# Patient Record
Sex: Female | Born: 2000 | Race: White | Hispanic: No | Marital: Single | Smoking: Never smoker
Health system: Southern US, Community
[De-identification: ages and names within clinical notes are randomized; demographics above are authoritative.]

## PROBLEM LIST (undated history)

## (undated) DIAGNOSIS — D580 Hereditary spherocytosis: Secondary | ICD-10-CM

## (undated) DIAGNOSIS — M928 Other specified juvenile osteochondrosis: Secondary | ICD-10-CM

## (undated) HISTORY — DX: Other specified juvenile osteochondrosis: M92.8

## (undated) HISTORY — DX: Hereditary spherocytosis: D58.0

---
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
## 2018-01-09 ENCOUNTER — Telehealth: Payer: Self-pay | Admitting: Pediatrics

## 2018-01-09 NOTE — Telephone Encounter (Signed)
Mother Claire Kaiser [phone] came by to make appt for pt to be seen and re start birth control for PCOS. Pt cell [phone]. Pt uses [facility] on [address] [place]. Out since Sunday 01/06/18. Gave mother nurse line ph# [phone]. Advised pt to call to schedule appt. Per mother pt will be going to college in the fall.

## 2018-01-11 ENCOUNTER — Other Ambulatory Visit: Payer: Self-pay | Admitting: Pediatrics

## 2018-01-11 MED ORDER — NORETHIN ACE-ETH ESTRAD-FE 1.5-30 MG-MCG PO TABS: 1.0000 | ORAL_TABLET | Freq: Every day | ORAL | 11 refills | Status: DC

## 2018-01-11 NOTE — Telephone Encounter (Signed)
Called mother and stated patient will need to be seen before we can refill. She states she will relay information to patient and she will call and schedule at her convenience.

## 2018-01-11 NOTE — Telephone Encounter (Signed)
Done

## 2018-01-17 ENCOUNTER — Ambulatory Visit (INDEPENDENT_AMBULATORY_CARE_PROVIDER_SITE_OTHER): Payer: BC Managed Care – PPO | Admitting: Pediatrics

## 2018-01-17 ENCOUNTER — Encounter: Payer: Self-pay | Admitting: Pediatrics

## 2018-01-17 VITALS — BP 174/93 | HR 100 | Ht 67.32 in | Wt 267.2 lb

## 2018-01-17 DIAGNOSIS — Z23 Encounter for immunization: Secondary | ICD-10-CM | POA: Diagnosis not present

## 2018-01-17 DIAGNOSIS — E282 Polycystic ovarian syndrome: Secondary | ICD-10-CM | POA: Diagnosis not present

## 2018-01-17 DIAGNOSIS — Z9081 Acquired absence of spleen: Secondary | ICD-10-CM | POA: Diagnosis not present

## 2018-01-17 DIAGNOSIS — I10 Essential (primary) hypertension: Secondary | ICD-10-CM

## 2018-01-17 DIAGNOSIS — Z113 Encounter for screening for infections with a predominantly sexual mode of transmission: Secondary | ICD-10-CM | POA: Diagnosis not present

## 2018-01-17 DIAGNOSIS — D580 Hereditary spherocytosis: Secondary | ICD-10-CM | POA: Diagnosis not present

## 2018-01-17 DIAGNOSIS — L700 Acne vulgaris: Secondary | ICD-10-CM

## 2018-01-17 MED ORDER — LISINOPRIL 5 MG PO TABS: 5.0000 mg | ORAL_TABLET | Freq: Every day | ORAL | 3 refills | Status: DC

## 2018-01-17 MED ORDER — NORETHIN ACE-ETH ESTRAD-FE 1.5-30 MG-MCG PO TABS: 1.0000 | ORAL_TABLET | Freq: Every day | ORAL | 3 refills | Status: DC

## 2018-01-17 NOTE — Patient Instructions (Addendum)
You need one more dose of the meningiococcal B vaccine. Get this at your pediatrician.  Take your blood pressures for a few days when you get home. Send them to me on mychart.  Labs today. We will send the results on mychart.

## 2018-01-17 NOTE — Progress Notes (Signed)
THIS RECORD MAY CONTAIN CONFIDENTIAL INFORMATION THAT SHOULD NOT BE RELEASED WITHOUT REVIEW OF THE SERVICE PROVIDER.  Adolescent Medicine Consultation Follow-Up Visit Claire Kaiser  is a 17 y.o. female referred by Ermalinda BarriosBrassfield, Mark, MD here today for follow-up regarding PCOS, OCP.    Last seen in [facility] on 06/2016 for the above.  Plan at last visit included continuing OCP.  Pertinent Labs? No Growth Chart Viewed? yes   History was provided by the patient and mother.  Interpreter? no  PCP Confirmed?  Needs to establish with adult provider- going to college in the fall    My Chart Activated?   no   Chief Complaint  Patient presents with  . Follow-up    HPI:    Has not been in clinic in two years. Mom has a history of hypertension.  Mom and dad's family with cancer. Dad has Kallman syndrome.    LMP last week. Continues on OCP which is going well. She occasionally does continuous cycling to skip a period if she has something important going on.   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 Has questions about vaccines that she needs and would like the flu shot today.   Doing well in school and enjoying activities. Not doing much physical activity. Had a recent scar revision from a dog bite on her face. Feels that her blood pressure probably has been high- has had some headaches and some pounding that she can feel.   [place]ing to [place] tomorrow with school for 5 days.   Review of Systems  Constitutional: Negative for malaise/fatigue.  Eyes: Negative for double vision.  Respiratory: Negative for shortness of breath.   Cardiovascular: Negative for chest pain and palpitations.  Gastrointestinal: Negative for abdominal pain, constipation, diarrhea, nausea and vomiting.  Genitourinary: Negative for dysuria.  Musculoskeletal: Negative for joint pain and myalgias.  Skin: Negative for rash.  Neurological: Positive for headaches. Negative for dizziness.   Endo/Heme/Allergies: Does not bruise/bleed easily.  Psychiatric/Behavioral: Negative for depression. The patient is not nervous/anxious and does not have insomnia.      Patient's last menstrual period was 01/10/2018 (approximate). No Known Allergies Outpatient Medications Prior to Visit  Medication Sig Dispense Refill  . albuterol (PROAIR HFA) 108 (90 Base) MCG/ACT inhaler Inhale into the lungs. Reported on 06/14/2016    . Melatonin 5 MG TABS Take by mouth daily as needed.  0  . norethindrone-ethinyl estradiol-iron (MICROGESTIN FE,GILDESS FE,LOESTRIN FE) 1.5-30 MG-MCG tablet Take 1 tablet by mouth daily. 1 Package 11   No facility-administered medications prior to visit.      Patient Active Problem List   Diagnosis Date Noted  . Acquired asplenia 01/17/2018  . PCOS (polycystic ovarian syndrome) 03/02/2016  . Oligomenorrhea 01/16/2016  . Acne 12/22/2015  . Keratosis pilaris 12/22/2015  . Hereditary spherocytosis (HCC) 12/22/2015    Social History: Changes with school since last visit?  yes, going to [employer]  Activities:  Special interests/hobbies/sports: theater   Lifestyle habits that can impact QOL: Sleep:sleeping well  Eating habits/patterns: varied  Water intake: fair    Confidentiality was discussed with the patient and if applicable, with caregiver as well.  Changes at home or school since last visit:  no  Gender identity: female Sex assigned at birth: female Pronouns: she Tobacco?  no Drugs/ETOH?  yes, ETOH socially, MJ occasionally  Partner preference?  female  Sexually Active?  no  Pregnancy Prevention:  birth control pills Reviewed condoms:  yes Reviewed EC:  yes   Suicidal  or homicidal thoughts?   no Self injurious behaviors?  no Guns in the home?  no    The following portions of the patient's history were reviewed and updated as appropriate: allergies, current medications, past family history, past medical history, past social history,  past surgical history and problem list.  Physical Exam:  Vitals:   01/17/18 1503  BP: (!) 174/93  Pulse: 100  Weight: 267 lb 3.2 oz (121.2 kg)  Height: 5' 7.32" (1.71 m)   BP (!) 174/93   Pulse 100   Ht 5' 7.32" (1.71 m)   Wt 267 lb 3.2 oz (121.2 kg)   LMP 01/10/2018 (Approximate)   BMI 41.45 kg/m  Body mass index: body mass index is 41.45 kg/m. Blood pressure percentiles are >99 % systolic and >99 % diastolic based on the August 2017 AAP Clinical Practice Guideline. Blood pressure percentile targets: 90: 126/78, 95: 129/82, 95 + 12 mmHg: 141/94. This reading is in the Stage 2 hypertension range (BP >= 140/90).   Physical Exam  Constitutional: She appears well-developed. No distress.  HENT:  Mouth/Throat: Oropharynx is clear and moist.  Neck: No thyromegaly present.  Cardiovascular: Normal rate and regular rhythm.  No murmur heard. Pulmonary/Chest: Breath sounds normal.  Abdominal: Soft. She exhibits no mass. There is no tenderness. There is no guarding.  Musculoskeletal: She exhibits no edema.  Lymphadenopathy:    She has no cervical adenopathy.  Neurological: She is alert.  Skin: Skin is warm. No rash noted.  Psychiatric: She has a normal mood and affect.  Nursing note and vitals reviewed.   Assessment/Plan: 1. PCOS (polycystic ovarian syndrome) Continue junel 1.5/30. We discussed metformin and will consider it depending on labs. She has continued to have some weight gain but does not have major signs of insulin resistance on exam. Has had a previous fasting insulin that was elevated.  - norethindrone-ethinyl estradiol-iron (MICROGESTIN FE,GILDESS FE,LOESTRIN FE) 1.5-30 MG-MCG tablet; Take 1 tablet by mouth daily.  Dispense: 4 Package; Refill: 3  2. Acne vulgaris Well controlled.   3. Essential hypertension Labs today to eval for end organ function. Will likely start lisinopril after she returns from her trip to [facility]. Mom has a BP cuff at home so she will check it  some when she gets home and then start medication. Mom takes only amlodipine.  - Comprehensive metabolic panel - Lipid panel - Vitamin D (25 hydroxy) - Hemoglobin A1c - TSH - Urinalysis, Routine w reflex microscopic - Protein / creatinine ratio, urine - lisinopril (PRINIVIL,ZESTRIL) 5 MG tablet; Take 1 tablet (5 mg total) by mouth daily.  Dispense: 30 tablet; Refill: 3  4. 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 Has otherwise been fine.   5. Acquired asplenia Needs second meningio B vaccine. Discussed this today. She will go get from PCP. We vaccinated for flu today. Discussed high flu incidence right now and taking precautions while traveling.   6. Need for vaccination Per patient request.  - Flu Vaccine QUAD 36+ mos IM  7. Routine screening for STI (sexually transmitted infection) Per clinic protocol.  - C. trachomatis/N. gonorrhoeae RNA   Follow-up:  3 weeks for nurse visit for BP, provider visit before college.   Medical decision-making:  >25 minutes spent face to face with patient with more than 50% of appointment spent discussing diagnosis, management, follow-up, and reviewing of PCOS, hypertension, aslpenia, vaccines.

## 2018-01-18 ENCOUNTER — Encounter: Payer: Self-pay | Admitting: Pediatrics

## 2018-01-18 DIAGNOSIS — I10 Essential (primary) hypertension: Secondary | ICD-10-CM | POA: Insufficient documentation

## 2018-01-18 LAB — COMPREHENSIVE METABOLIC PANEL
AG Ratio: 1.3 (calc) (ref 1.0–2.5)
ALT: 17 U/L (ref 5–32)
AST: 19 U/L (ref 12–32)
Albumin: 4.4 g/dL (ref 3.6–5.1)
Alkaline phosphatase (APISO): 55 U/L (ref 47–176)
BUN: 11 mg/dL (ref 7–20)
CHLORIDE: 104 mmol/L (ref 98–110)
CO2: 25 mmol/L (ref 20–32)
CREATININE: 0.71 mg/dL (ref 0.50–1.00)
Calcium: 9.7 mg/dL (ref 8.9–10.4)
GLOBULIN: 3.5 g/dL (ref 2.0–3.8)
GLUCOSE: 102 mg/dL — AB (ref 65–99)
Potassium: 4.9 mmol/L (ref 3.8–5.1)
SODIUM: 138 mmol/L (ref 135–146)
TOTAL PROTEIN: 7.9 g/dL (ref 6.3–8.2)
Total Bilirubin: 0.4 mg/dL (ref 0.2–1.1)

## 2018-01-18 LAB — HEMOGLOBIN A1C
EAG (MMOL/L): 5.5 (calc)
Hgb A1c MFr Bld: 5.1 % of total Hgb (ref <5.7)
MEAN PLASMA GLUCOSE: 100 (calc)

## 2018-01-18 LAB — LIPID PANEL
CHOL/HDL RATIO: 3 (calc) (ref <5.0)
Cholesterol: 158 mg/dL (ref <170)
HDL: 52 mg/dL (ref >45)
LDL Cholesterol (Calc): 87 mg/dL (calc) (ref <110)
NON-HDL CHOLESTEROL (CALC): 106 mg/dL (ref <120)
TRIGLYCERIDES: 95 mg/dL — AB (ref <90)

## 2018-01-18 LAB — PROTEIN / CREATININE RATIO, URINE

## 2018-01-18 LAB — VITAMIN D 25 HYDROXY (VIT D DEFICIENCY, FRACTURES): Vit D, 25-Hydroxy: 20 ng/mL — ABNORMAL LOW (ref 30–100)

## 2018-01-18 LAB — TSH: TSH: 0.65 m[IU]/L

## 2018-01-18 LAB — URINALYSIS, ROUTINE W REFLEX MICROSCOPIC

## 2018-01-24 ENCOUNTER — Encounter: Payer: Self-pay | Admitting: Pediatrics

## 2018-01-25 ENCOUNTER — Encounter: Payer: Self-pay | Admitting: Pediatrics

## 2018-01-26 ENCOUNTER — Encounter: Payer: Self-pay | Admitting: Pediatrics

## 2018-01-27 ENCOUNTER — Encounter: Payer: Self-pay | Admitting: Pediatrics

## 2018-02-11 ENCOUNTER — Other Ambulatory Visit: Payer: Self-pay | Admitting: Pediatrics

## 2018-02-11 ENCOUNTER — Ambulatory Visit (INDEPENDENT_AMBULATORY_CARE_PROVIDER_SITE_OTHER): Payer: BC Managed Care – PPO

## 2018-02-11 VITALS — BP 144/85 | HR 92

## 2018-02-11 DIAGNOSIS — I10 Essential (primary) hypertension: Secondary | ICD-10-CM | POA: Diagnosis not present

## 2018-02-11 MED ORDER — LISINOPRIL 10 MG PO TABS: 10.0000 mg | ORAL_TABLET | Freq: Every day | ORAL | 3 refills | Status: DC

## 2018-02-11 NOTE — Progress Notes (Signed)
Pt here today for blood pressure recheck. BP improved from last visit but will increase to 10 mg of Lisinopril qdaily due to elevated pressure reading per Daiva Nakayamaaroline Hacker,NP. NP would like to have follow up appointment set for 3 months from last visit. Appointment made. Pt to report adverse effects to [facility].

## 2018-04-08 ENCOUNTER — Encounter: Payer: Self-pay | Admitting: Pediatrics

## 2018-04-08 ENCOUNTER — Ambulatory Visit (INDEPENDENT_AMBULATORY_CARE_PROVIDER_SITE_OTHER): Payer: BC Managed Care – PPO | Admitting: Pediatrics

## 2018-04-08 VITALS — BP 138/92 | HR 100 | Ht 67.56 in | Wt 269.2 lb

## 2018-04-08 DIAGNOSIS — N914 Secondary oligomenorrhea: Secondary | ICD-10-CM | POA: Diagnosis not present

## 2018-04-08 DIAGNOSIS — E282 Polycystic ovarian syndrome: Secondary | ICD-10-CM

## 2018-04-08 DIAGNOSIS — I10 Essential (primary) hypertension: Secondary | ICD-10-CM

## 2018-04-08 NOTE — Patient Instructions (Signed)
Continue 10 mg lisinopril  Take at least 2000 IU of vitamin D daily  Send me some blood pressures.

## 2018-04-08 NOTE — Progress Notes (Signed)
THIS RECORD MAY CONTAIN CONFIDENTIAL INFORMATION THAT SHOULD NOT BE RELEASED WITHOUT REVIEW OF THE SERVICE PROVIDER.  Adolescent Medicine Consultation Follow-Up Visit [name]  is a 17 y.o. female referred by Ermalinda Barrios, MD here today for follow-up regarding hypertension and PCOS.    Last seen in [facility] on 01/17/18 for hypertension.  Plan at last visit included increasing Lisinopril from 5 mg to 10 mg.  Pertinent Labs? No Growth Chart Viewed? yes   History was provided by the patient and mother.  Interpreter? no  PCP Confirmed?  yes  HPI:  Claire Kaiser is an 17 year old female presenting for follow up of hypertension and PCOS. She is accompanied by her mother but she provides the history:  - Claire Kaiser reports at her last visit her blood pressure was elevated and her Lisinopril was increased from 5 mg to 10 mg - She took 1 month of 10 mg dosing, however, her pharmacy re-fill was for 5 mg and she did not notice until ~ 2 days ago - She denies any HA, vision changes, chest pain, numbness/tingling of extremities, urinary symptoms, fatigue or lethargy - In regards to her OCP, she has been taking it with excellent compliance - She always takes the placebo dose of her pack - Reports her menses have been regular lasting for ~ 5 days with associated mild cramping at onset - She has had no breakthrough bleeding  No LMP recorded. No Known Allergies Outpatient Medications Prior to Visit  Medication Sig Dispense Refill  . albuterol (PROAIR HFA) 108 (90 Base) MCG/ACT inhaler Inhale into the lungs. Reported on 06/14/2016    . lisinopril (PRINIVIL,ZESTRIL) 10 MG tablet Take 1 tablet (10 mg total) by mouth daily. 30 tablet 3  . Melatonin 5 MG TABS Take by mouth daily as needed.  0  . norethindrone-ethinyl estradiol-iron (MICROGESTIN FE,GILDESS FE,LOESTRIN FE) 1.5-30 MG-MCG tablet Take 1 tablet by mouth daily. 4 Package 3   No facility-administered medications prior to  visit.      Patient Active Problem List   Diagnosis Date Noted  . Essential hypertension 01/18/2018  . Acquired asplenia 01/17/2018  . PCOS (polycystic ovarian syndrome) 03/02/2016  . Oligomenorrhea 01/16/2016  . Acne 12/22/2015  . Keratosis pilaris 12/22/2015  . Hereditary spherocytosis (HCC) 12/22/2015    Social History: Changes with school since last visit?  Yes Claire Kaiser will soon be graduating high school She plans to do a summer internship at [employer] by [employer] and will then attend college in [place]   Physical Exam:  Vitals:   04/08/18 0829 04/08/18 0906  BP: (!) 150/89 (!) 138/92  Pulse: (!) 113 100  Weight: 269 lb 3.2 oz (122.1 kg)   Height: 5' 7.56" (1.716 m)    BP (!) 138/92   Pulse 100   Ht 5' 7.56" (1.716 m)   Wt 269 lb 3.2 oz (122.1 kg)   BMI 41.47 kg/m  Body mass index: body mass index is 41.47 kg/m. Blood pressure percentiles are not available for patients who are 18 years or older.  Physical Exam  Constitutional: She is oriented to person, place, and time. She appears well-developed and well-nourished.  HENT:  Head: Normocephalic.  Mouth/Throat: Oropharynx is clear and moist.  Eyes: Pupils are equal, round, and reactive to light. Conjunctivae and EOM are normal.  Neck: Normal range of motion. No thyromegaly present.  Cardiovascular: Normal rate, regular rhythm and normal heart sounds.  Pulmonary/Chest: Effort normal and breath sounds normal.  Abdominal: Soft. Bowel sounds  are normal.  Neurological: She is alert and oriented to person, place, and time.  Skin: Skin is warm. Capillary refill takes 2 to 3 seconds.  Psychiatric: She has a normal mood and affect.    Assessment/Plan: Claire Kaiser is an 17 year old female with essential hypertension and PCOS presenting for follow-up of her chronic conditions. At her last visit on 01/17/18 her blood pressure was elevated to 174/93 and she was subsequently increased from 5 mg to 10 mg of Lisinopril. Unfortunately, due  to re-fill error, she has still been taking 5 mg. Review of history and physical exam is negative for red flag symptoms of hypertension and blood pressure is 138/92. Plan to start 10 mg dosing of Lisinopril and follow up in 3 months for any necessary titrations.   Essential Hypertension - Lisinopril 10 mg daily - Keep track of blood pressure daily - Follow up in 3 months  PCOS - Continue Norethindrone-ethinyl estradiol   Follow-up:  No follow-ups on file.   Medical decision-making:  > 30 minutes spent face to face with patient with more than 50% of appointment spent discussing diagnosis, management, follow-up, and reviewing of blood pressure.   Melida Quitter MD PGY-2 Pediatrics

## 2018-07-08 ENCOUNTER — Ambulatory Visit (INDEPENDENT_AMBULATORY_CARE_PROVIDER_SITE_OTHER): Payer: BC Managed Care – PPO | Admitting: Pediatrics

## 2018-07-08 ENCOUNTER — Encounter: Payer: Self-pay | Admitting: Pediatrics

## 2018-07-08 VITALS — BP 128/85 | HR 85 | Ht 67.52 in | Wt 271.0 lb

## 2018-07-08 DIAGNOSIS — E559 Vitamin D deficiency, unspecified: Secondary | ICD-10-CM

## 2018-07-08 DIAGNOSIS — I10 Essential (primary) hypertension: Secondary | ICD-10-CM | POA: Diagnosis not present

## 2018-07-08 DIAGNOSIS — N914 Secondary oligomenorrhea: Secondary | ICD-10-CM | POA: Diagnosis not present

## 2018-07-08 DIAGNOSIS — E282 Polycystic ovarian syndrome: Secondary | ICD-10-CM

## 2018-07-08 MED ORDER — LISINOPRIL 10 MG PO TABS: 10.0000 mg | ORAL_TABLET | Freq: Every day | ORAL | 3 refills | Status: DC

## 2018-07-08 MED ORDER — NORETHIN ACE-ETH ESTRAD-FE 1.5-30 MG-MCG PO TABS: 1.0000 | ORAL_TABLET | Freq: Every day | ORAL | 3 refills | Status: DC

## 2018-07-08 NOTE — Progress Notes (Signed)
History was provided by the patient and mother.  Claire SosKatherine E Kaiser is a 17 y.o. female who is here for essential hypertension, PCOS, secondary oligomenorrhea.  Ermalinda BarriosBrassfield, Mark, MD   HPI:  Pt reports she recently had an ear infection post flying and having a cold. Took amox and it is better.  [place].   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 This has been a good experience.   She is happy that her blood pressure has improved today.   She is having regular periods and no concerns with her birth control.   She continues to remove the hair under her chin, however, it is not bothersome to her and she does not feel like we need to add spironolactone at this point.   She is excited about leaving for [employer] in [place] soon. She can return to clinic here around Christmas break.    Patient's last menstrual period was 06/17/2018 (approximate).  Review of Systems  Constitutional: Negative for malaise/fatigue.  Eyes: Negative for double vision.  Respiratory: Negative for shortness of breath.   Cardiovascular: Negative for chest pain and palpitations.  Gastrointestinal: Negative for abdominal pain, constipation, diarrhea, nausea and vomiting.  Genitourinary: Negative for dysuria.  Musculoskeletal: Negative for joint pain and myalgias.  Skin: Negative for rash.  Neurological: Positive for headaches. Negative for dizziness.  Endo/Heme/Allergies: Does not bruise/bleed easily.    Patient Active Problem List   Diagnosis Date Noted  . Essential hypertension 01/18/2018  . Acquired asplenia 01/17/2018  . PCOS (polycystic ovarian syndrome) 03/02/2016  . Oligomenorrhea 01/16/2016  . Acne 12/22/2015  . Keratosis pilaris 12/22/2015  . Hereditary spherocytosis (HCC) 12/22/2015    Current Outpatient Medications on File Prior to Visit  Medication Sig Dispense Refill  . albuterol (PROAIR HFA) 108 (90 Base) MCG/ACT inhaler Inhale into  the lungs. Reported on 06/14/2016    . Melatonin 5 MG TABS Take by mouth daily as needed.  0  . norethindrone-ethinyl estradiol-iron (MICROGESTIN FE,GILDESS FE,LOESTRIN FE) 1.5-30 MG-MCG tablet Take 1 tablet by mouth daily. 4 Package 3  . lisinopril (PRINIVIL,ZESTRIL) 10 MG tablet Take 1 tablet (10 mg total) by mouth daily. 30 tablet 3   No current facility-administered medications on file prior to visit.     No Known Allergies  Social History: Confidentiality was discussed with the patient and if applicable, with caregiver as well. Tobacco: none Secondhand smoke exposure? no Drugs/EtOH: none Sexually active? no  Safety: safe at home Last STI Screening: last visit Pregnancy Prevention: OCP  Physical Exam:    Vitals:   07/08/18 0842  BP: 128/85  Pulse: 85  Weight: 271 lb (122.9 kg)  Height: 5' 7.52" (1.715 m)    Blood pressure percentiles are not available for patients who are 18 years or older.  Physical Exam  Constitutional: She is oriented to person, place, and time. She appears well-developed and well-nourished.  HENT:  Head: Normocephalic.  Neck: No thyromegaly present.  Cardiovascular: Normal rate, regular rhythm, normal heart sounds and intact distal pulses.  Pulmonary/Chest: Effort normal and breath sounds normal.  Abdominal: Soft. Bowel sounds are normal. There is no tenderness.  Musculoskeletal: Normal range of motion.  Neurological: She is alert and oriented to person, place, and time.  Skin: Skin is warm and dry.  Psychiatric: She has a normal mood and affect.    Assessment/Plan: 1. PCOS (polycystic ovarian syndrome) Will continue OCP. She does not feel that she needs spironolcatone  for now. Will repeat labs when she comes around christmas. Continues on vitamin D 2000 IU daily.  - norethindrone-ethinyl estradiol-iron (MICROGESTIN FE,GILDESS FE,LOESTRIN FE) 1.5-30 MG-MCG tablet; Take 1 tablet by mouth daily.  Dispense: 4 Package; Refill: 3  2. Secondary  oligomenorrhea OCP managing periods well.   3. Essential hypertension conitnue lisinopril 10 mg for now. Discussed that her blood pressure still isn't all the way at target, however, I anticipate her physical activity will likely increase at school so we will reassess when she comes at Christmas. If she has any visits to acute care providers in [place] and it is persistently elevated, we can increase lisinopril prior to next visit. She is in agreement.

## 2018-11-18 ENCOUNTER — Ambulatory Visit: Payer: BC Managed Care – PPO | Admitting: Pediatrics

## 2018-11-20 ENCOUNTER — Ambulatory Visit (INDEPENDENT_AMBULATORY_CARE_PROVIDER_SITE_OTHER): Payer: BC Managed Care – PPO | Admitting: Pediatrics

## 2018-11-20 ENCOUNTER — Encounter: Payer: Self-pay | Admitting: Pediatrics

## 2018-11-20 ENCOUNTER — Other Ambulatory Visit: Payer: Self-pay

## 2018-11-20 VITALS — BP 139/84 | HR 96 | Ht 67.32 in | Wt 268.4 lb

## 2018-11-20 DIAGNOSIS — E559 Vitamin D deficiency, unspecified: Secondary | ICD-10-CM | POA: Diagnosis not present

## 2018-11-20 DIAGNOSIS — I10 Essential (primary) hypertension: Secondary | ICD-10-CM | POA: Diagnosis not present

## 2018-11-20 DIAGNOSIS — E282 Polycystic ovarian syndrome: Secondary | ICD-10-CM | POA: Diagnosis not present

## 2018-11-20 DIAGNOSIS — Z113 Encounter for screening for infections with a predominantly sexual mode of transmission: Secondary | ICD-10-CM | POA: Diagnosis not present

## 2018-11-20 MED ORDER — LISINOPRIL 20 MG PO TABS: 20.0000 mg | ORAL_TABLET | Freq: Every day | ORAL | 2 refills | Status: DC

## 2018-11-20 NOTE — Progress Notes (Signed)
THIS RECORD MAY CONTAIN CONFIDENTIAL INFORMATION THAT SHOULD NOT BE RELEASED WITHOUT REVIEW OF THE SERVICE PROVIDER.  Adolescent Medicine Consultation Follow-Up Visit Claire Kaiser  is a 17 y.o. female referred by Ermalinda Barrios, MD here today for follow-up regarding HTN, PCOS, and secondary oligomenorrhea.    Last seen in [facility] Clinic on 07/08/18 for HTN, PCOS, secondary oligomenorrhea   Plan at last visit included  1. Continue OCP (microgestin FE), did not want spironolactone at that time  2. Repeat lab visit at next visit  3. Continue vitamin D 2000 IU daily  4. Continue lisinopril 10mg  and increased physical activity    History was provided by the patient.   Chief Complaint  Patient presents with  . Follow-up    HPI:   Claire Kaiser is a 17 y.o. female w PMHx of PCOS, HTN, and secondary oligomenorrhea presenting for routine follow up. 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 Patient reports enjoying college. States she is doing well, is busy but good. States she is making lots of friends.   Overall patient feels PCOS is well controlled. States she "feels great". Periods have been regular, occurring monthly. Periods last 4 days with medium flow. During periods her cramps "are not terrible", usually just the first day and are well controlled with ibuprofen and midol. Patient does notice more back pain during her periods. Pain is in her lower lumbar/sacral area. Usually back pain starts 1 day before onset of period and lasts the first 2 days of her period.   Reports no significant weight changes. Does state she lost some weight during first semester but gained some weight back due to stress eating during finals. No significant hair growth. Normal appetite (other than during finals). Patient not interested in spironolactone at this time.   Patient reports that although BP is elevated today, her BP is always lower at home. Reports  systolic pressure is usually 10 points lower at home. Has not checked BP since going away for college. Denies CP, palpitations, HA, vision changes, dizziness, or SOB.  No LMP recorded. No Known Allergies Outpatient Medications Prior to Visit  Medication Sig Dispense Refill  . albuterol (PROAIR HFA) 108 (90 Base) MCG/ACT inhaler Inhale into the lungs. Reported on 06/14/2016    . Melatonin 5 MG TABS Take by mouth daily as needed.  0  . norethindrone-ethinyl estradiol-iron (MICROGESTIN FE,GILDESS FE,LOESTRIN FE) 1.5-30 MG-MCG tablet Take 1 tablet by mouth daily. 4 Package 3  . lisinopril (PRINIVIL,ZESTRIL) 10 MG tablet Take 1 tablet (10 mg total) by mouth daily. 90 tablet 3   No facility-administered medications prior to visit.      Patient Active Problem List   Diagnosis Date Noted  . Vitamin D insufficiency 07/08/2018  . Essential hypertension 01/18/2018  . Acquired asplenia 01/17/2018  . PCOS (polycystic ovarian syndrome) 03/02/2016  . Oligomenorrhea 01/16/2016  . Acne 12/22/2015  . Keratosis pilaris 12/22/2015  . Hereditary spherocytosis (HCC) 12/22/2015    The following portions of the patient's history were reviewed and updated as appropriate: allergies, current medications, past family history, past medical history, past social history, past surgical history and problem list.  Physical Exam:  Vitals:   11/20/18 1407 11/20/18 1410  BP: (!) 142/89 139/84  Pulse: 96 96  Weight: 268 lb 6.4 oz (121.7 kg)   Height: 5' 7.32" (1.71 m)    BP 139/84   Pulse 96   Ht 5' 7.32" (1.71 m)   Wt 268  lb 6.4 oz (121.7 kg)   BMI 41.63 kg/m  Body mass index: body mass index is 41.63 kg/m. Blood pressure percentiles are not available for patients who are 18 years or older.  Physical Exam Vitals signs and nursing note reviewed.  Constitutional:      Appearance: Normal appearance.  HENT:     Head: Normocephalic.     Nose: Nose normal.     Mouth/Throat:     Mouth: Mucous membranes are  moist.  Eyes:     Conjunctiva/sclera: Conjunctivae normal.     Pupils: Pupils are equal, round, and reactive to light.  Neck:     Musculoskeletal: Normal range of motion.  Cardiovascular:     Rate and Rhythm: Normal rate and regular rhythm.     Pulses: Normal pulses.     Heart sounds: No murmur. No friction rub. No gallop.   Pulmonary:     Effort: Pulmonary effort is normal.     Breath sounds: Normal breath sounds. No wheezing, rhonchi or rales.  Abdominal:     General: Abdomen is flat. Bowel sounds are normal.     Palpations: There is no mass.     Tenderness: There is no abdominal tenderness.  Musculoskeletal: Normal range of motion.        General: No tenderness.  Skin:    General: Skin is warm.  Neurological:     General: No focal deficit present.     Mental Status: She is alert.  Psychiatric:        Mood and Affect: Mood normal.        Behavior: Behavior normal.     Assessment/Plan: 1. PCOS (polycystic ovarian syndrome)  Will continue OCP. Does not want spironolactone now but states she may decide she wants it in the future. Will repeat baseline labs today. Continue on vitamin D 2000 IU daily. Back pain likely 2/2 new mattress at school. Advised using ibuprofen as needed.  - continue norethindrone-ethinyl estradiol-iron (MICROGESTIN FE,GILDESS FE,LOESTRIN FE) 1.5-30 MG-MCG tablet; Take 1 tablet by mouth daily.  Dispense: 4 Package; Refill: 3 -continue Vit D -PCOS Labs  Last A1C 5.1 on 01/17/18 Due 01/17/2019  Last CMP 01/17/18 (wnl) Due 01/17/19  Last lipid panel 01/17/18 (wnl, slightly elevated triglyceride level of 95)  Due 01/18/2020  Last Vit D 01/07/18, low Due today  2. Secondary oligomenorrhea  OCP managing periods well. -continue OCP    3. Essential hypertension Elevated today. Will plan to increase lisinopril to 20 mg. Advised to check BP at home to ensure BP is well controlled on new regimen. Advised RN BP check in 1 week to ensure adequate BP control.     Follow-up:  Return in about 6 months (around 05/22/2019).   Orpah ClintonSherin Zakhai Meisinger, DO, PGY-2 [facility] 11/20/2018 3:00 PM

## 2018-11-20 NOTE — Patient Instructions (Addendum)
Come back for blood pressure check on 12/30.  Come back during spring break or around Easter.  We will get labs next time you come in  Let us know if you need anything!

## 2018-11-22 LAB — C. TRACHOMATIS/N. GONORRHOEAE RNA
C. trachomatis RNA, TMA: NOT DETECTED
N. gonorrhoeae RNA, TMA: NOT DETECTED

## 2018-12-02 ENCOUNTER — Ambulatory Visit: Payer: BC Managed Care – PPO

## 2018-12-02 VITALS — BP 139/90 | HR 104

## 2018-12-02 DIAGNOSIS — I10 Essential (primary) hypertension: Secondary | ICD-10-CM

## 2018-12-02 NOTE — Progress Notes (Signed)
Pt here today for BP recheck since increase in lisinopril. BP elevated today. Reading may be altered by increase in stress due [place] and recent travel. Plan to f/u in March. Pt to contact [facility] if she has questions or concerns.

## 2019-02-26 ENCOUNTER — Ambulatory Visit: Payer: BC Managed Care – PPO | Admitting: Pediatrics

## 2019-02-26 ENCOUNTER — Telehealth (INDEPENDENT_AMBULATORY_CARE_PROVIDER_SITE_OTHER): Payer: BC Managed Care – PPO | Admitting: Pediatrics

## 2019-02-26 DIAGNOSIS — I10 Essential (primary) hypertension: Secondary | ICD-10-CM | POA: Diagnosis not present

## 2019-02-26 DIAGNOSIS — E282 Polycystic ovarian syndrome: Secondary | ICD-10-CM | POA: Diagnosis not present

## 2019-02-26 MED ORDER — LISINOPRIL 20 MG PO TABS: 20.0000 mg | ORAL_TABLET | Freq: Every day | ORAL | 2 refills | Status: DC

## 2019-02-26 MED ORDER — METFORMIN HCL ER 500 MG PO TB24: 1500.0000 mg | ORAL_TABLET | Freq: Every day | ORAL | 2 refills | Status: DC

## 2019-02-26 NOTE — Telephone Encounter (Signed)
Tried TC to patient at 1:33 pm. No answer and unable to LVM.   Virtual Visit via Telephone Note  I connected with patient  on 02/26/19 at 1:45 pm by telephone and verified that I am speaking with the correct person using two identifiers.   I discussed the limitations, risks, security and privacy concerns of performing an evaluation and management service by telephone and the availability of in person appointments. I discussed that the purpose of this phone visit is to provide medical care while limiting exposure to the novel coronavirus.  I also discussed with the patient that there may be a patient responsible charge related to this service. The patient expressed understanding and agreed to proceed.  Reason for visit: PCOS, hypertension   History of Present Illness:  Talked about adding metformin at last visit for things like weight fluctuation. She has had more of this during this semester. She is not noticing changes in number necessarily, but she would notice clothing feeling tighter or looser week to week. She can't say exactly why. No changes to diet and exercise on these weeks. She is interested in starting metformin at this point.   She is starting to walk the dog and do some yoga from home. She is asplenic so is trying to stay safe and healthy.   She hasn't been checking her blood pressure at all. She hasn't noticed any heart racing, or other symptoms that may indicate it is high. She has started meditation at school for stress management which has helped her a lot.   She has some hair that grows on her chin and neck area. Hasn't noticed any major increase in this. She manages it with shaving. No acne issues that she has had. Her skin has been good despite stress.   School hasn't called off end of the year, will re-eval in 2 weeks. She has not started distance learning yet- this is their scheduled spring break. She will start on Monday. Waiting on communication from professors.     Assessment and Plan:  1. PCOS - continue OCP. Start metformin XR 500 mg. Take a multivitamin every day when you are on Metformin. Take Metformin XR 500 mg 1 pill at dinner once daily for 2 weeks Then, take Metformin XR 500 mg 2 pills at dinner once daily for 2 weeks Then, take Metformin XR 500 mg 3 pills at dinner once daily until you see the doctor for your next visit. If you have too much nausea or diarrhea, decrease your dose for 2 weeks and then try to go back up again.  We will get labs prior to return to school in August   2. Hypertension Continue lisinopril 20 mg. She will take her blood pressure at home over the coming week and send via [employer]. Continue meditation and increasing physical activity   3. Vit d deficiency  Continue daily vit D supplement.    Follow Up Instructions: Late august for labs.    I discussed the assessment and treatment plan with the patient and/or parent/guardian. They were provided an opportunity to ask questions and all were answered. They agreed with the plan and demonstrated an understanding of the instructions.   They were advised to call back or seek an in-person evaluation if the symptoms worsen or if the condition fails to improve as anticipated.  I provided 15 minutes of non-face-to-face time during this encounter. I was located off site during this encounter.  Alfonso Ramus, FNP

## 2019-05-26 ENCOUNTER — Other Ambulatory Visit: Payer: Self-pay | Admitting: Pediatrics

## 2019-05-26 DIAGNOSIS — E282 Polycystic ovarian syndrome: Secondary | ICD-10-CM

## 2019-05-31 ENCOUNTER — Encounter: Payer: Self-pay | Admitting: Pediatrics

## 2019-07-28 ENCOUNTER — Other Ambulatory Visit: Payer: Self-pay | Admitting: Family

## 2019-07-28 DIAGNOSIS — E282 Polycystic ovarian syndrome: Secondary | ICD-10-CM

## 2019-07-28 MED ORDER — NORETHIN ACE-ETH ESTRAD-FE 1.5-30 MG-MCG PO TABS: 1.0000 | ORAL_TABLET | Freq: Every day | ORAL | 3 refills | Status: DC

## 2019-08-18 ENCOUNTER — Other Ambulatory Visit: Payer: Self-pay | Admitting: Pediatrics

## 2019-08-18 DIAGNOSIS — E282 Polycystic ovarian syndrome: Secondary | ICD-10-CM

## 2019-08-25 ENCOUNTER — Other Ambulatory Visit: Payer: Self-pay | Admitting: Pediatrics

## 2019-08-25 DIAGNOSIS — E282 Polycystic ovarian syndrome: Secondary | ICD-10-CM

## 2019-08-25 MED ORDER — METFORMIN HCL ER 500 MG PO TB24: ORAL_TABLET | ORAL | 6 refills | Status: DC

## 2020-03-15 ENCOUNTER — Other Ambulatory Visit: Payer: Self-pay | Admitting: Pediatrics

## 2020-03-15 DIAGNOSIS — E282 Polycystic ovarian syndrome: Secondary | ICD-10-CM

## 2020-03-31 ENCOUNTER — Telehealth (INDEPENDENT_AMBULATORY_CARE_PROVIDER_SITE_OTHER): Payer: BC Managed Care – PPO | Admitting: Pediatrics

## 2020-03-31 DIAGNOSIS — I10 Essential (primary) hypertension: Secondary | ICD-10-CM

## 2020-03-31 MED ORDER — LISINOPRIL 20 MG PO TABS: 20.0000 mg | ORAL_TABLET | Freq: Every day | ORAL | 2 refills | Status: DC

## 2020-03-31 NOTE — Patient Instructions (Signed)
Your Lisinopril 20mg  daily was refilled We will try to get your visit rescheduled for when you are back in town and then [facility] get your labs drawn too.

## 2020-03-31 NOTE — Progress Notes (Signed)
This note is not being shared with the patient for the following reason: To prevent harm (release of this note would result in harm to the life or physical safety of the patient or another).  THIS RECORD MAY CONTAIN CONFIDENTIAL INFORMATION THAT SHOULD NOT BE RELEASED WITHOUT REVIEW OF THE SERVICE PROVIDER.  Virtual Follow-Up Visit via Video Note  I connected with Claire Kaiser, the patient  on 03/31/20 at 11:30 AM EDT by a video enabled telemedicine application and verified that I am speaking with the correct person using two identifiers.   Patient/parent location: [place] at [employer]  - Unable to complete full visit today because patient currently out of state.  - Discussed visit for when patient is back in [place] - Refilled Lisinopril 20mg  qD as patient states she is running out of this med  Follow-up: At a later date when patient returns to [employer] [facility]. Plan to do labs (HgbA1C, CMP, Lipid Panel, Vit D level) and BP check around that time also   I spent 10 minutes on this telehealth visit inclusive of face-to-face video and care coordination time I was located remote office during this encounter.   [facility], MD    CC: Teodoro Kil, MD, No ref. provider found

## 2020-08-02 ENCOUNTER — Other Ambulatory Visit: Payer: Self-pay | Admitting: Pediatrics

## 2020-08-02 DIAGNOSIS — E282 Polycystic ovarian syndrome: Secondary | ICD-10-CM

## 2020-08-02 MED ORDER — NORETHIN ACE-ETH ESTRAD-FE 1.5-30 MG-MCG PO TABS: 1.0000 | ORAL_TABLET | Freq: Every day | ORAL | 3 refills | Status: DC

## 2020-09-08 ENCOUNTER — Other Ambulatory Visit: Payer: Self-pay | Admitting: Family

## 2020-09-08 DIAGNOSIS — E282 Polycystic ovarian syndrome: Secondary | ICD-10-CM

## 2020-11-10 ENCOUNTER — Other Ambulatory Visit: Payer: Self-pay | Admitting: Pediatrics

## 2020-11-10 DIAGNOSIS — E282 Polycystic ovarian syndrome: Secondary | ICD-10-CM

## 2021-05-12 ENCOUNTER — Other Ambulatory Visit: Payer: Self-pay | Admitting: Pediatrics

## 2021-05-12 DIAGNOSIS — E282 Polycystic ovarian syndrome: Secondary | ICD-10-CM

## 2021-05-24 ENCOUNTER — Other Ambulatory Visit: Payer: Self-pay | Admitting: Pediatrics

## 2021-05-24 DIAGNOSIS — I10 Essential (primary) hypertension: Secondary | ICD-10-CM

## 2021-05-24 DIAGNOSIS — E282 Polycystic ovarian syndrome: Secondary | ICD-10-CM

## 2021-05-25 ENCOUNTER — Other Ambulatory Visit: Payer: Self-pay | Admitting: Pediatrics

## 2021-05-25 DIAGNOSIS — E282 Polycystic ovarian syndrome: Secondary | ICD-10-CM

## 2021-05-25 DIAGNOSIS — I10 Essential (primary) hypertension: Secondary | ICD-10-CM

## 2021-05-25 MED ORDER — METFORMIN HCL ER 500 MG PO TB24
ORAL_TABLET | ORAL | 0 refills | Status: DC
Start: 2021-05-25 — End: 2021-08-22

## 2021-05-25 MED ORDER — LISINOPRIL 20 MG PO TABS: 20.0000 mg | ORAL_TABLET | Freq: Every day | ORAL | 1 refills | Status: DC

## 2021-06-23 ENCOUNTER — Ambulatory Visit: Payer: BC Managed Care – PPO | Admitting: Pediatrics

## 2021-07-25 ENCOUNTER — Ambulatory Visit: Payer: BC Managed Care – PPO | Admitting: Pediatrics

## 2021-07-25 ENCOUNTER — Other Ambulatory Visit (HOSPITAL_COMMUNITY)
Admission: RE | Admit: 2021-07-25 | Discharge: 2021-07-25 | Disposition: A | Discharge status: Home / Self Care | Payer: BC Managed Care – PPO | Source: Ambulatory Visit | Attending: Pediatrics | Admitting: Pediatrics

## 2021-07-25 ENCOUNTER — Other Ambulatory Visit: Payer: Self-pay

## 2021-07-25 VITALS — BP 138/89 | HR 109 | Ht 67.25 in | Wt 262.4 lb

## 2021-07-25 DIAGNOSIS — Z23 Encounter for immunization: Secondary | ICD-10-CM | POA: Diagnosis not present

## 2021-07-25 DIAGNOSIS — Z124 Encounter for screening for malignant neoplasm of cervix: Secondary | ICD-10-CM

## 2021-07-25 DIAGNOSIS — E282 Polycystic ovarian syndrome: Secondary | ICD-10-CM

## 2021-07-25 DIAGNOSIS — I10 Essential (primary) hypertension: Secondary | ICD-10-CM | POA: Diagnosis not present

## 2021-07-25 DIAGNOSIS — Z113 Encounter for screening for infections with a predominantly sexual mode of transmission: Secondary | ICD-10-CM | POA: Insufficient documentation

## 2021-07-25 DIAGNOSIS — N898 Other specified noninflammatory disorders of vagina: Secondary | ICD-10-CM | POA: Diagnosis not present

## 2021-07-25 DIAGNOSIS — Z3202 Encounter for pregnancy test, result negative: Secondary | ICD-10-CM | POA: Diagnosis not present

## 2021-07-25 LAB — POCT URINE PREGNANCY: Preg Test, Ur: NEGATIVE

## 2021-07-25 MED ORDER — NORGESTIMATE-ETH ESTRADIOL 0.25-35 MG-MCG PO TABS: ORAL_TABLET | ORAL | 4 refills | Status: AC

## 2021-07-25 NOTE — Patient Instructions (Signed)
Take a series of blood pressures at home and send them to me  Labs today- will send results on mychart!

## 2021-07-25 NOTE — Progress Notes (Signed)
History was provided by the patient.  Claire Kaiser is a 20 y.o. female who is here for hypertension, pcos, needs pap.  Ermalinda Barrios, MD   HPI:  Pt reports she has one more year of college. Majoring in [place] and religion and wants to go in to law/[employer]/non[employer]- looking in the [place] area.   Anxious with blood pressures. Has not done any ambulatory monitoring.   Still taking metformin which is going well.   OCP is going well. In the past 6 months has noticed more spotting than she did in the past. In the week leaving up to cycle she can have some spotting. Open to making change in OCP today. Not sexually active and has never been.   No LMP recorded.   Patient Active Problem List   Diagnosis Date Noted   Vitamin D insufficiency 07/08/2018   Essential hypertension 01/18/2018   Acquired asplenia 01/17/2018   PCOS (polycystic ovarian syndrome) 03/02/2016   Oligomenorrhea 01/16/2016   Acne 12/22/2015   Keratosis pilaris 12/22/2015   Hereditary spherocytosis (HCC) 12/22/2015    Current Outpatient Medications on File Prior to Visit  Medication Sig Dispense Refill   Acetaminophen (TYLENOL PO) Take by mouth.     albuterol (VENTOLIN HFA) 108 (90 Base) MCG/ACT inhaler Inhale into the lungs. Reported on 06/14/2016     AUROVELA FE 1.5/30 1.5-30 MG-MCG tablet TAKE 1 TABLET BY MOUTH EVERY DAY 112 tablet 3   Cholecalciferol (VITAMIN D) 50 MCG (2000 UT) CAPS Take by mouth.     lisinopril (ZESTRIL) 20 MG tablet Take 1 tablet (20 mg total) by mouth daily. 90 tablet 1   Melatonin 5 MG TABS Take by mouth daily as needed.  0   metFORMIN (GLUCOPHAGE-XR) 500 MG 24 hr tablet TAKE 3 TABLETS(1500 MG) BY MOUTH DAILY WITH BREAKFAST 270 tablet 0   Multiple Vitamin (MULTIVITAMIN) tablet Take 1 tablet by mouth daily.     No current facility-administered medications on file prior to visit.    No Known Allergies  Social History: Confidentiality was discussed with the patient and if  applicable, with caregiver as well. Tobacco: no Secondhand smoke exposure? no Drugs/EtOH: no Sexually active? no  Safety: safe to self and at home Last STI Screening:today Pregnancy Prevention: ocp  Physical Exam:    Vitals:   07/25/21 1347 07/25/21 1350  BP: (!) 147/85 138/89  Pulse: (!) 102 (!) 109  Weight: 262 lb 6.4 oz (119 kg)   Height: 5' 7.25" (1.708 m)     Growth percentile SmartLinks can only be used for patients less than 62 years old.  Physical Exam Vitals and nursing note reviewed. Exam conducted with a chaperone present.  Constitutional:      General: She is not in acute distress.    Appearance: She is well-developed.  Neck:     Thyroid: No thyromegaly.  Cardiovascular:     Rate and Rhythm: Normal rate and regular rhythm.     Heart sounds: No murmur heard. Pulmonary:     Breath sounds: Normal breath sounds.  Abdominal:     Palpations: Abdomen is soft. There is no mass.     Tenderness: There is no abdominal tenderness. There is no guarding.  Genitourinary:    Labia:        Right: No rash.        Left: No rash.      Vagina: Normal.     Cervix: Normal.     Comments: Cervix difficult to visualize fully  due to discomfort during exam. Some discharge in vaginal vault Musculoskeletal:     Right lower leg: No edema.     Left lower leg: No edema.  Lymphadenopathy:     Cervical: No cervical adenopathy.  Skin:    General: Skin is warm.     Capillary Refill: Capillary refill takes less than 2 seconds.     Findings: No rash.  Neurological:     Mental Status: She is alert.     Comments: No tremor    Assessment/Plan: 1. PCOS (polycystic ovarian syndrome) Will change to sprintec to see if she has any benefit in controlling bleeding. Also may have less weight fluctuations with change in progestin. Yearly labs today.  - norgestimate-ethinyl estradiol (SPRINTEC 28) 0.25-35 MG-MCG tablet; Take 1 tablet daily. Discard placebos and take active pills for continuous  cycling  Dispense: 112 tablet; Refill: 4 - CBC with Differential/Platelet - Comprehensive metabolic panel - Hemoglobin A1c - Lipid panel - VITAMIN D 25 Hydroxy (Vit-D Deficiency, Fractures)  2. Essential hypertension Will get series of ambulatory pressures and make change in lisinopril if needed.   3. Pap smear for cervical cancer screening Pap today- difficult to get excellent sample d/t discomfort  - Cytology - PAP  4. Need for tetanus, diphtheria, and acellular pertussis (Tdap) vaccine Need tetanus update.  - Tdap vaccine greater than or equal to 7yo IM  5. Vaginal discharge Will screen for infection given discharge noted.  - WET PREP BY MOLECULAR PROBE  6. Routine screening for STI (sexually transmitted infection) Per protocol.  - Urine cytology ancillary only  Return in 6 months or sooner as needed   Alfonso Ramus, FNP

## 2021-07-26 LAB — COMPREHENSIVE METABOLIC PANEL
AG Ratio: 1.4 (calc) (ref 1.0–2.5)
ALT: 26 U/L (ref 6–29)
AST: 23 U/L (ref 10–30)
Albumin: 4.6 g/dL (ref 3.6–5.1)
Alkaline phosphatase (APISO): 50 U/L (ref 31–125)
BUN: 13 mg/dL (ref 7–25)
CO2: 20 mmol/L (ref 20–32)
Calcium: 9.5 mg/dL (ref 8.6–10.2)
Chloride: 106 mmol/L (ref 98–110)
Creat: 0.65 mg/dL (ref 0.50–0.96)
Globulin: 3.3 g/dL (calc) (ref 1.9–3.7)
Glucose, Bld: 116 mg/dL — ABNORMAL HIGH (ref 65–99)
Potassium: 4.3 mmol/L (ref 3.5–5.3)
Sodium: 137 mmol/L (ref 135–146)
Total Bilirubin: 0.4 mg/dL (ref 0.2–1.2)
Total Protein: 7.9 g/dL (ref 6.1–8.1)

## 2021-07-26 LAB — WET PREP BY MOLECULAR PROBE
Candida species: NOT DETECTED
Gardnerella vaginalis: NOT DETECTED
MICRO NUMBER:: 12274245
SPECIMEN QUALITY:: ADEQUATE
Trichomonas vaginosis: NOT DETECTED

## 2021-07-26 LAB — LIPID PANEL
Cholesterol: 191 mg/dL (ref <200)
HDL: 47 mg/dL — ABNORMAL LOW (ref >=50)
LDL Cholesterol (Calc): 117 mg/dL (calc) — ABNORMAL HIGH
Non-HDL Cholesterol (Calc): 144 mg/dL (calc) — ABNORMAL HIGH (ref <130)
Total CHOL/HDL Ratio: 4.1 (calc) (ref <5.0)
Triglycerides: 152 mg/dL — ABNORMAL HIGH (ref <150)

## 2021-07-26 LAB — HEMOGLOBIN A1C
Hgb A1c MFr Bld: 5 % of total Hgb (ref <5.7)
Mean Plasma Glucose: 97 mg/dL
eAG (mmol/L): 5.4 mmol/L

## 2021-07-26 LAB — CBC WITH DIFFERENTIAL/PLATELET
Absolute Monocytes: 887 cells/uL (ref 200–950)
Basophils Absolute: 57 cells/uL (ref 0–200)
Basophils Relative: 0.4 %
Eosinophils Absolute: 157 cells/uL (ref 15–500)
Eosinophils Relative: 1.1 %
HCT: 40.8 % (ref 35.0–45.0)
Hemoglobin: 14.3 g/dL (ref 11.7–15.5)
Lymphs Abs: 3961 cells/uL — ABNORMAL HIGH (ref 850–3900)
MCH: 29.9 pg (ref 27.0–33.0)
MCHC: 35 g/dL (ref 32.0–36.0)
MCV: 85.2 fL (ref 80.0–100.0)
MPV: 9.7 fL (ref 7.5–12.5)
Monocytes Relative: 6.2 %
Neutro Abs: 9238 cells/uL — ABNORMAL HIGH (ref 1500–7800)
Neutrophils Relative %: 64.6 %
Platelets: 631 10*3/uL — ABNORMAL HIGH (ref 140–400)
RBC: 4.79 10*6/uL (ref 3.80–5.10)
RDW: 13.1 % (ref 11.0–15.0)
Total Lymphocyte: 27.7 %
WBC: 14.3 10*3/uL — ABNORMAL HIGH (ref 3.8–10.8)

## 2021-07-26 LAB — VITAMIN D 25 HYDROXY (VIT D DEFICIENCY, FRACTURES): Vit D, 25-Hydroxy: 32 ng/mL (ref 30–100)

## 2021-07-28 LAB — URINE CYTOLOGY ANCILLARY ONLY
Bacterial Vaginitis-Urine: NEGATIVE
Candida Urine: NEGATIVE
Chlamydia: NEGATIVE
Comment: NEGATIVE
Comment: NEGATIVE
Comment: NORMAL
Neisseria Gonorrhea: NEGATIVE
Trichomonas: NEGATIVE

## 2021-07-28 LAB — CYTOLOGY - PAP
Chlamydia: NEGATIVE
Comment: NEGATIVE
Comment: NORMAL
Diagnosis: NEGATIVE
Diagnosis: REACTIVE
Neisseria Gonorrhea: NEGATIVE

## 2021-08-22 ENCOUNTER — Other Ambulatory Visit: Payer: Self-pay | Admitting: Pediatrics

## 2021-08-22 DIAGNOSIS — E282 Polycystic ovarian syndrome: Secondary | ICD-10-CM

## 2021-11-20 ENCOUNTER — Other Ambulatory Visit: Payer: Self-pay | Admitting: Pediatrics

## 2021-11-20 DIAGNOSIS — E282 Polycystic ovarian syndrome: Secondary | ICD-10-CM

## 2021-11-20 DIAGNOSIS — I10 Essential (primary) hypertension: Secondary | ICD-10-CM

## 2022-02-18 ENCOUNTER — Other Ambulatory Visit: Payer: Self-pay | Admitting: Pediatrics

## 2022-02-18 DIAGNOSIS — E282 Polycystic ovarian syndrome: Secondary | ICD-10-CM

## 2022-05-19 ENCOUNTER — Other Ambulatory Visit: Payer: Self-pay | Admitting: Pediatrics

## 2022-05-19 DIAGNOSIS — I10 Essential (primary) hypertension: Secondary | ICD-10-CM

## 2022-05-19 DIAGNOSIS — E282 Polycystic ovarian syndrome: Secondary | ICD-10-CM

## 2022-08-28 ENCOUNTER — Other Ambulatory Visit: Payer: Self-pay | Admitting: Family

## 2022-08-28 DIAGNOSIS — E282 Polycystic ovarian syndrome: Secondary | ICD-10-CM
